# Patient Record
Sex: Male | Born: 1990 | Race: White | Hispanic: No | Marital: Single | Smoking: Current every day smoker
Health system: Southern US, Community
[De-identification: ages and names within clinical notes are randomized; demographics above are authoritative.]

## PROBLEM LIST (undated history)

## (undated) DIAGNOSIS — N186 End stage renal disease: Secondary | ICD-10-CM

## (undated) DIAGNOSIS — I10 Essential (primary) hypertension: Secondary | ICD-10-CM

## (undated) HISTORY — DX: Essential (primary) hypertension: I10

## (undated) HISTORY — DX: End stage renal disease: N18.6

## (undated) HISTORY — PX: AV FISTULA PLACEMENT: SHX1204

## (undated) HISTORY — PX: KIDNEY TRANSPLANT: SHX239

---
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
## 2010-08-04 ENCOUNTER — Ambulatory Visit: Payer: Self-pay | Admitting: Internal Medicine

## 2010-08-19 ENCOUNTER — Inpatient Hospital Stay: Payer: Self-pay | Admitting: Vascular Surgery

## 2010-09-04 ENCOUNTER — Ambulatory Visit: Payer: Self-pay | Admitting: Internal Medicine

## 2010-09-28 ENCOUNTER — Ambulatory Visit: Payer: Self-pay | Admitting: Vascular Surgery

## 2010-10-05 ENCOUNTER — Ambulatory Visit: Payer: Self-pay | Admitting: Vascular Surgery

## 2010-11-11 ENCOUNTER — Ambulatory Visit: Payer: Self-pay | Admitting: Vascular Surgery

## 2010-11-24 ENCOUNTER — Ambulatory Visit: Payer: Self-pay | Admitting: Vascular Surgery

## 2010-11-28 ENCOUNTER — Ambulatory Visit: Payer: Self-pay | Admitting: Vascular Surgery

## 2010-12-13 ENCOUNTER — Ambulatory Visit: Payer: Self-pay | Admitting: Vascular Surgery

## 2011-02-01 ENCOUNTER — Ambulatory Visit: Payer: Self-pay | Admitting: Internal Medicine

## 2011-08-15 ENCOUNTER — Other Ambulatory Visit: Payer: Self-pay | Admitting: Emergency Medicine

## 2011-08-15 LAB — BASIC METABOLIC PANEL
Anion Gap: 11 (ref 7–16)
BUN: 27 mg/dL — ABNORMAL HIGH (ref 7–18)
Chloride: 101 mmol/L (ref 98–107)
Creatinine: 7.43 mg/dL — ABNORMAL HIGH (ref 0.60–1.30)
EGFR (African American): 12 — ABNORMAL LOW
EGFR (Non-African Amer.): 10 — ABNORMAL LOW
Sodium: 140 mmol/L (ref 136–145)

## 2012-05-07 IMAGING — CR DG CHEST 1V PORT
1 series · 1 of 1 positions shown · non-contrast
Comparison: none

REASON FOR EXAM: line placement
COMMENTS:

[view not recorded]
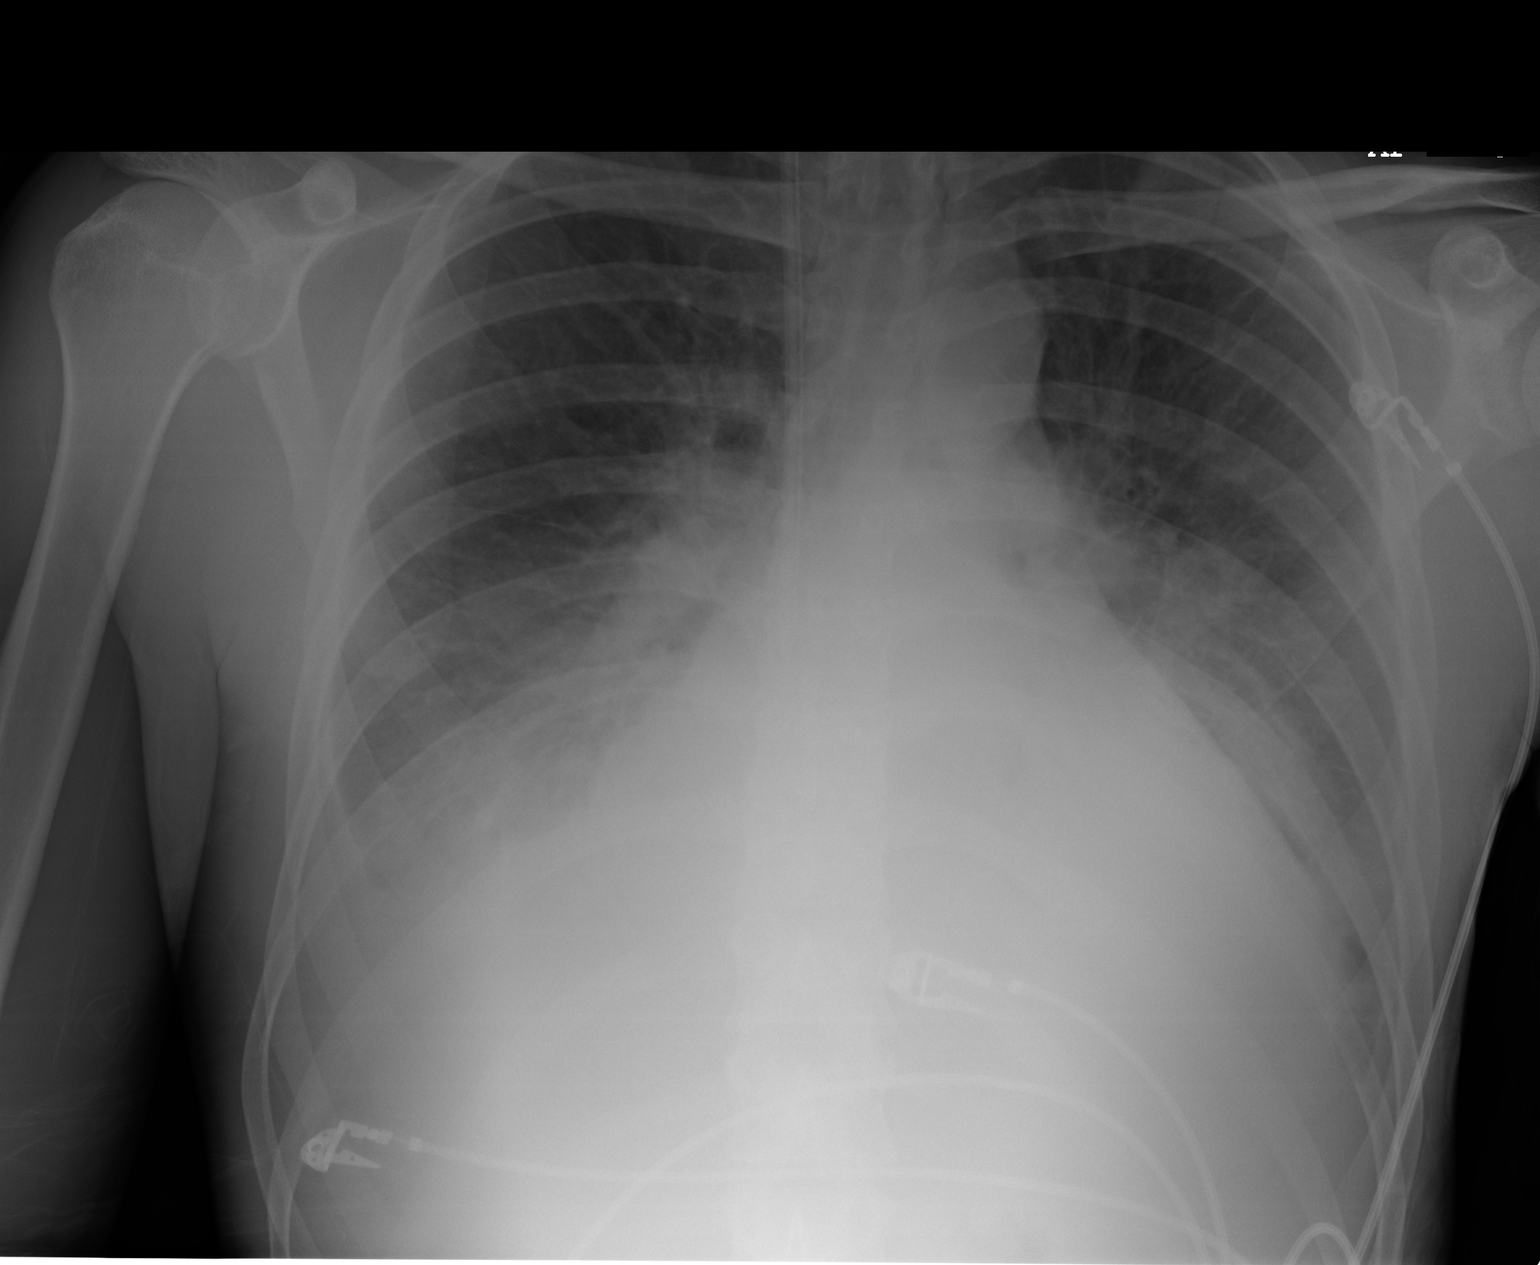

[1 of 1 positions shown; findings below may reference images not displayed]

PROCEDURE:     DXR - DXR PORTABLE CHEST SINGLE VIEW  - August 19, 2010  [DATE]

RESULT:     Comparison is made to the study of 08/19/2010. The cardiac
silhouette is borderline enlarged. There is a dual lumen central venous
catheter present with the distal tip appearing to be in the right atrium.
Bilateral pleural effusions persist but may be slightly smaller. Basilar
atelectasis is present. Mild pulmonary vascular congestion is demonstrated.
IMPRESSION: Interval placement of a dual lumen right-sided central
venous catheter as described.

## 2012-05-07 IMAGING — CR DG CHEST 2V
1 series · 2 of 2 positions shown · non-contrast
Comparison: none

REASON FOR EXAM: pain
COMMENTS:   May transport without cardiac monitor

[Series 1: view not recorded · 0.17mm/px · 2 of 2 slices shown]
[im 1/2]
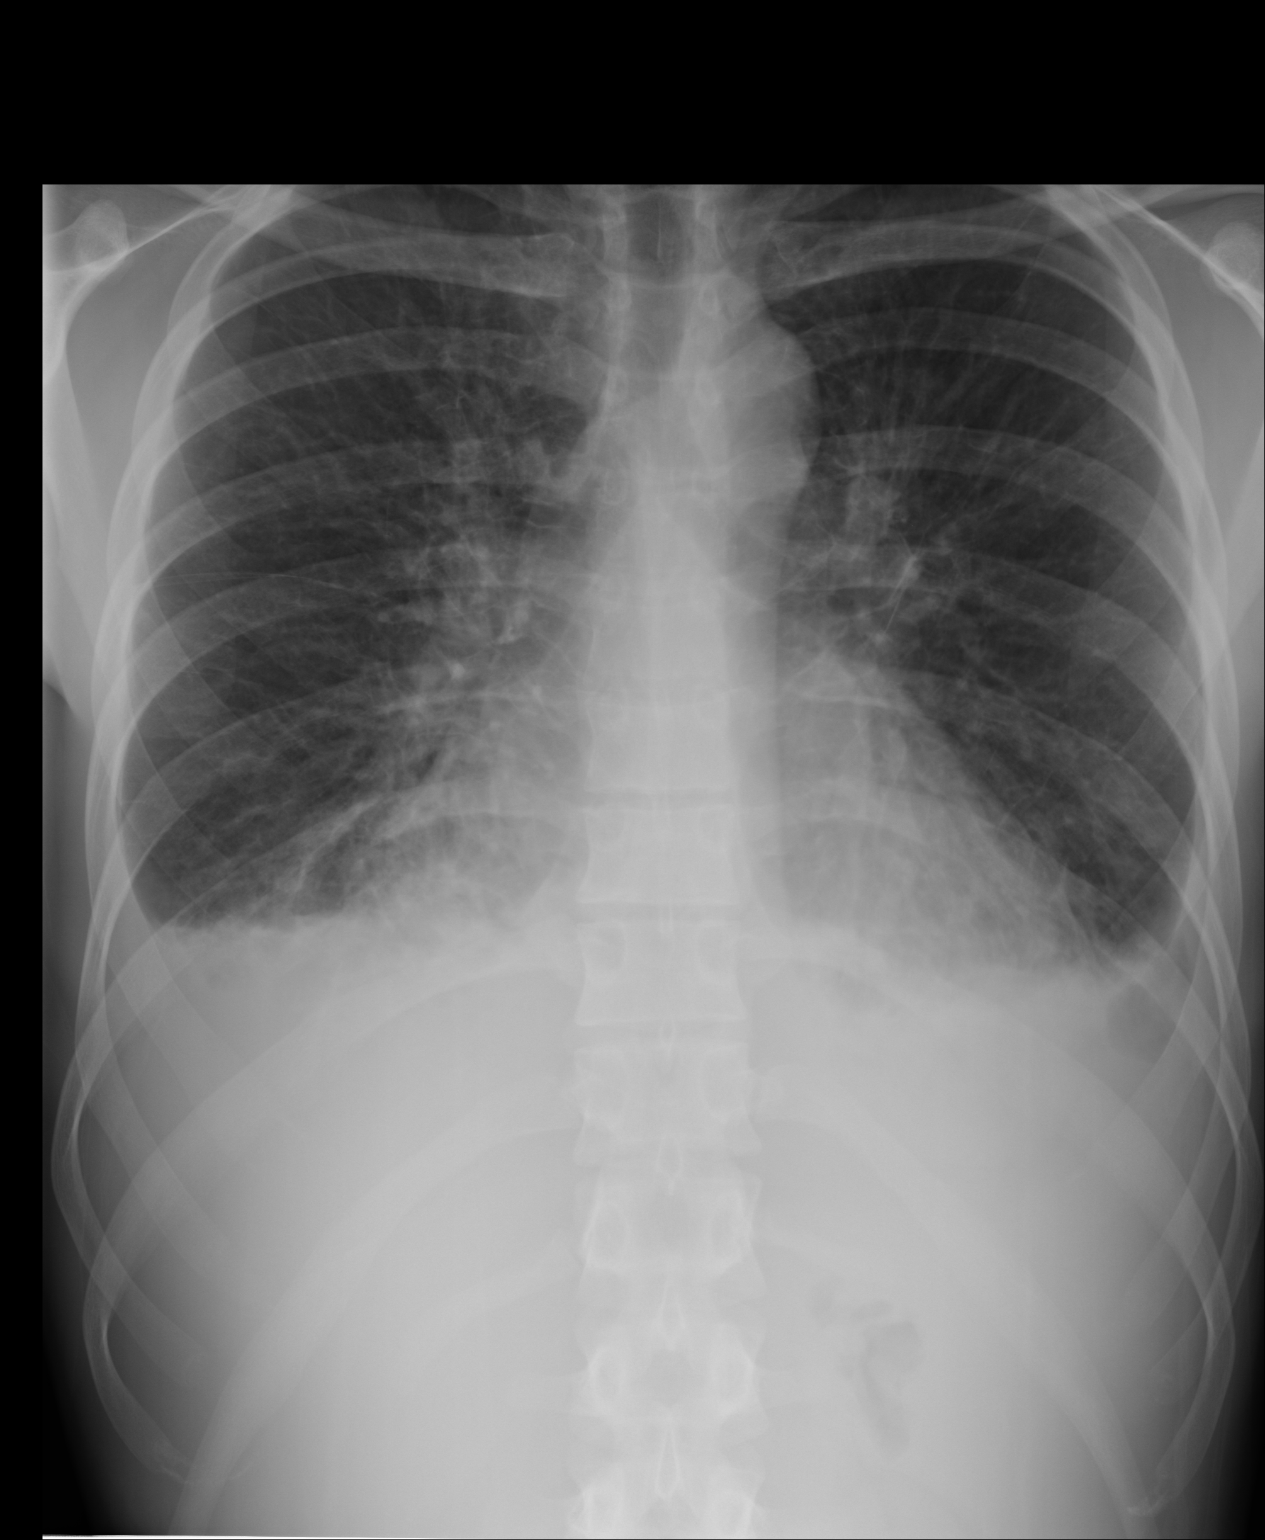
[im 2/2]
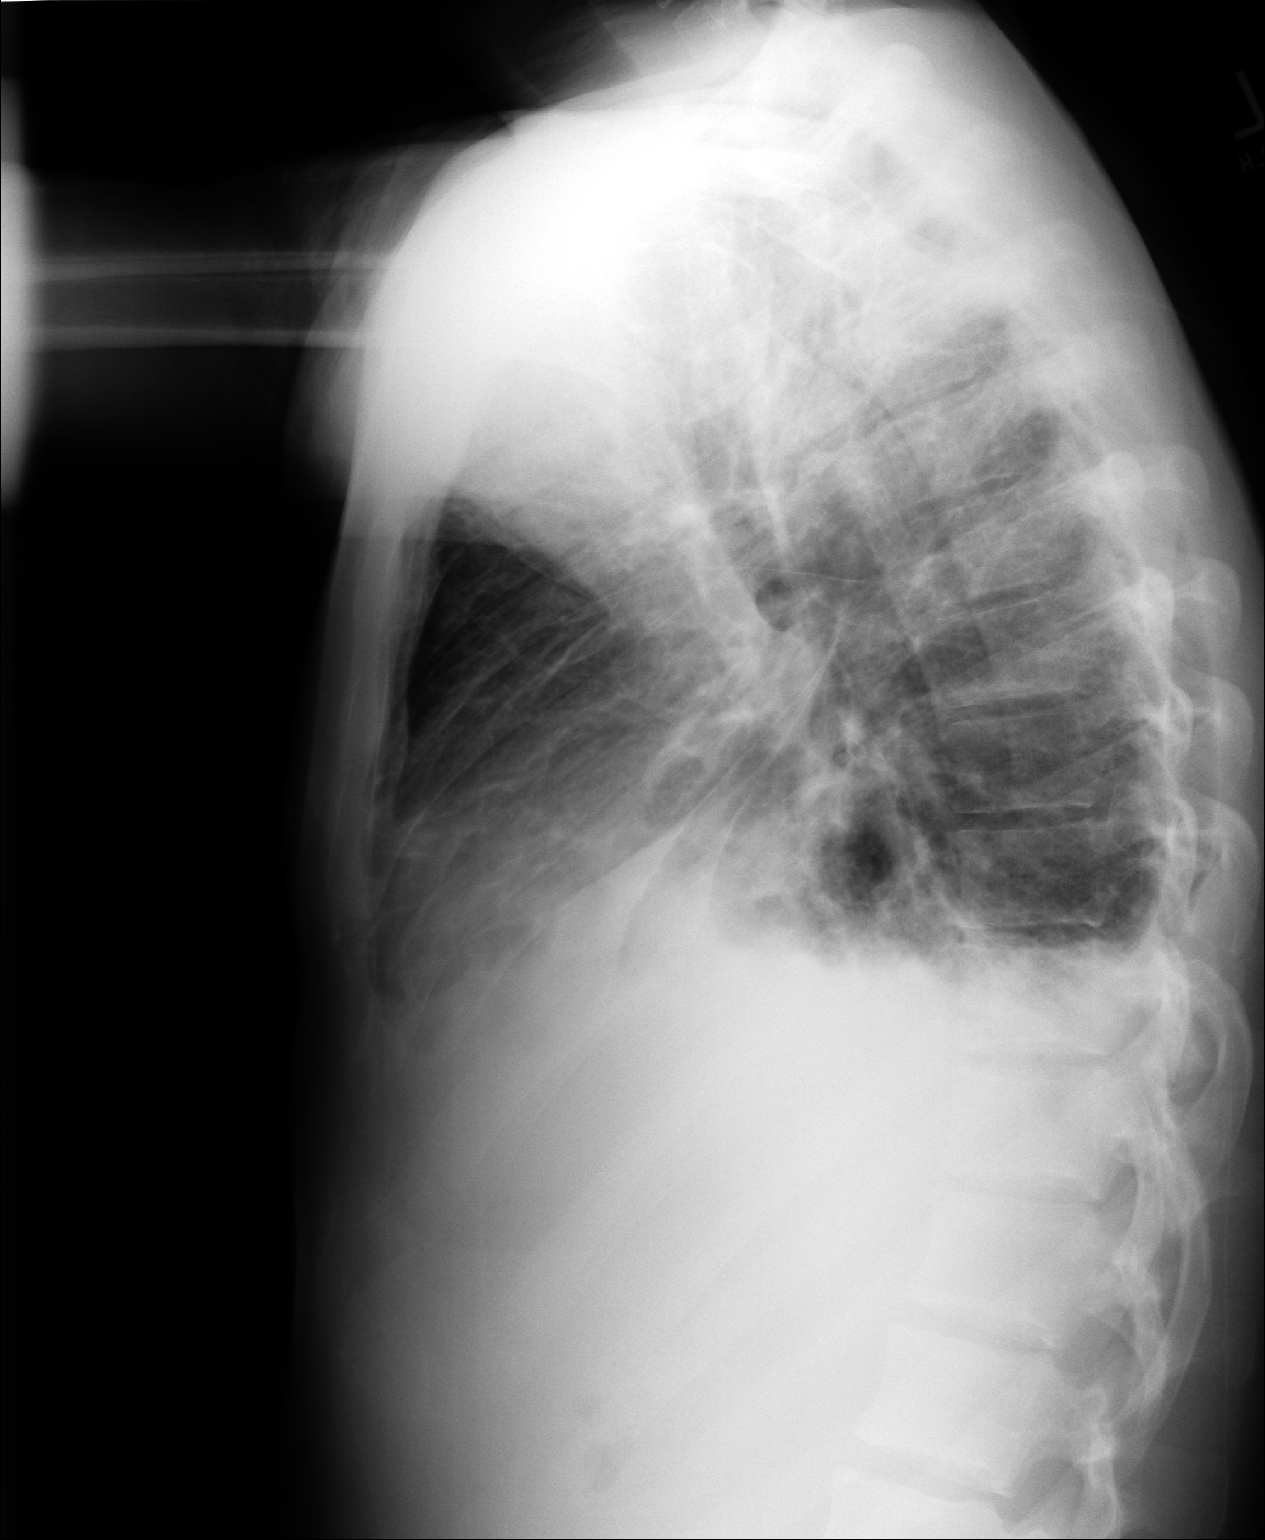

[2 of 2 positions shown; findings below may reference images not displayed]

PROCEDURE:     DXR - DXR CHEST PA (OR AP) AND LATERAL  - August 19, 2010  [DATE]

RESULT:     PA and lateral views of the chest were obtained. There is
prominence of the pulmonary vascularity compatible with pulmonary vascular
congestion. There are small bibasilar pleural effusions. The heart appears
mildly enlarged. Although cardiac disease cannot be excluded, in view of the
patient's age, noncardiac causes are more likely. Pneumonia, renal failure,
collagen disease, pulmonary emboli and allergic lung reactions are all
considerations in the differential at this point. The mediastinal and
osseous structures are normal in appearance.
IMPRESSION: 1.     There are bibasilar pleural effusions with associated pulmonary
vascular congestion. The etiology at this point is uncertain. As noted
above, for the patient's age, noncardiac etiologies are considered more
likely.

## 2012-05-09 IMAGING — CT CT CHEST W/O CM
2 of 3 series · 17 of 31 positions shown, 20 images · non-contrast
Comparison: none

REASON FOR EXAM: hemoptysis and renal failure-please do high res cuts
COMMENTS:

[Series 2: soft tissue · axial · 0.65mm/px · z∈[-812,-562]mm · 7 of 76 slices shown]
[im 11/76  mediastinal]
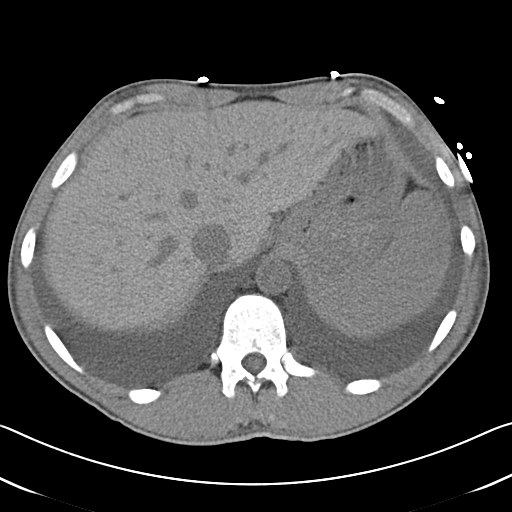
[im 21/76  mediastinal]
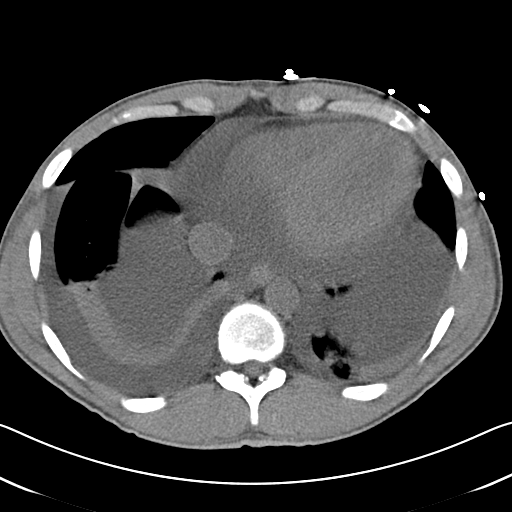
[im 31/76  mediastinal]
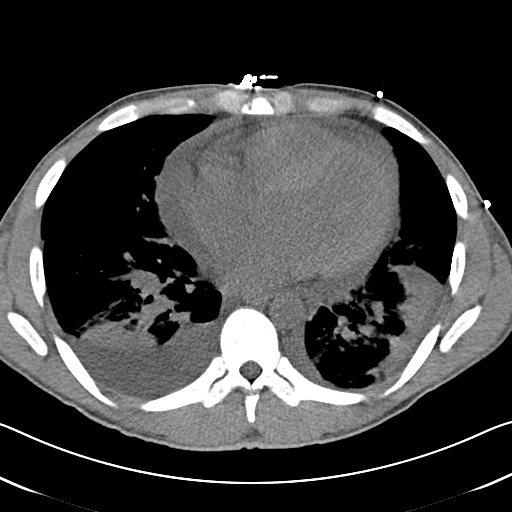
[im 36/76  mediastinal]
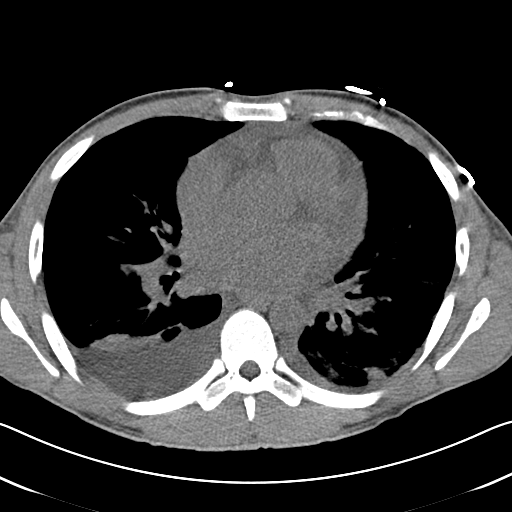
[im 41/76  mediastinal]
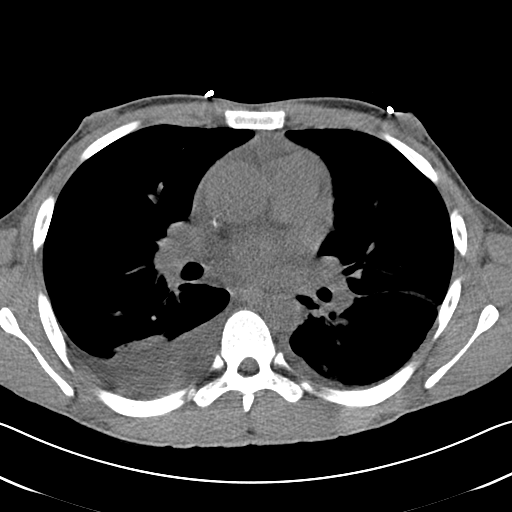
[im 51/76  mediastinal]
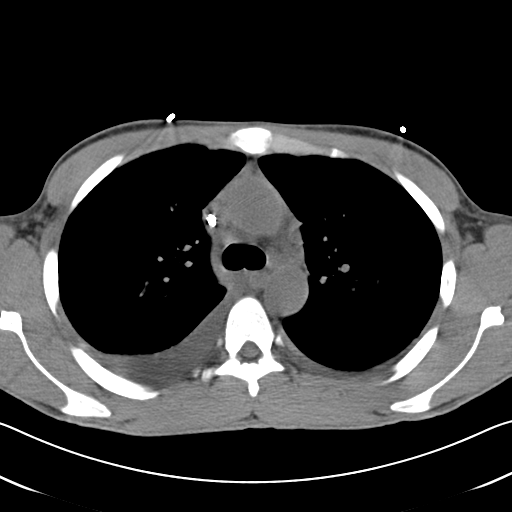
[im 61/76  mediastinal]
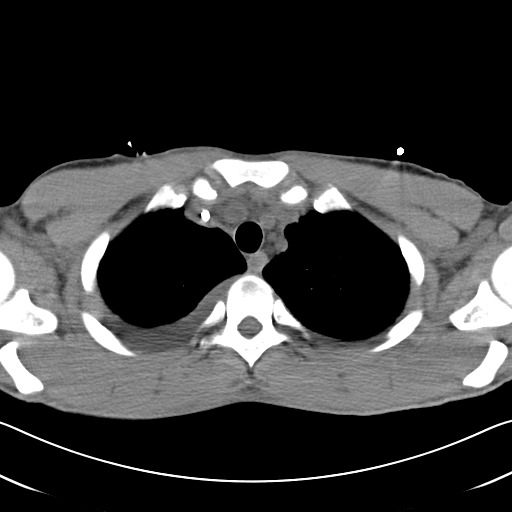

[Series 4: hi res · axial · 0.65mm/px · z∈[-834,-522]mm · 10 of 49 slices shown, 13 images]
[im 5/49  mediastinal]
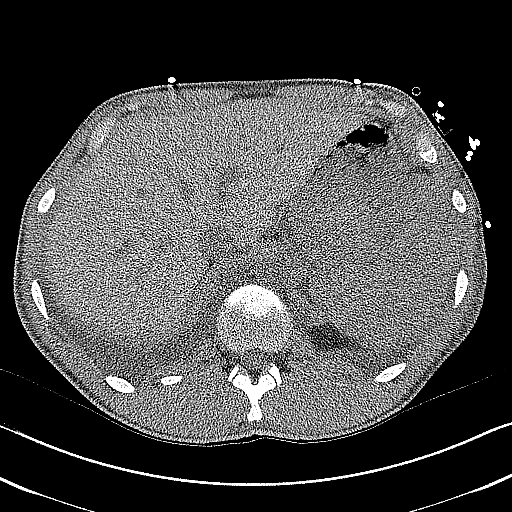
[im 5/49  lung]
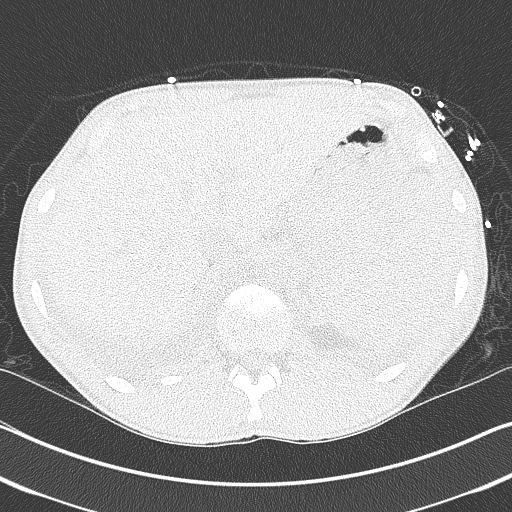
[im 10/49  lung]
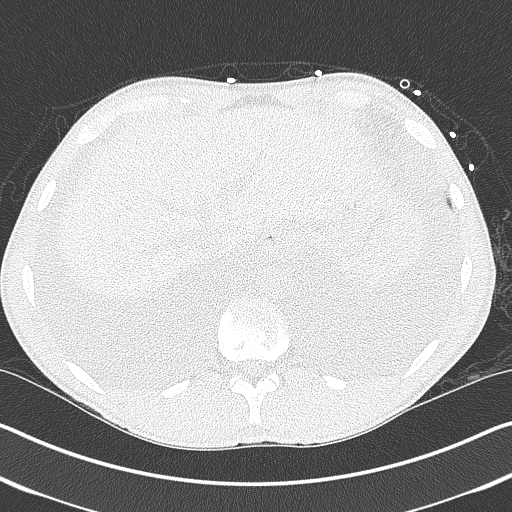
[im 15/49  lung]
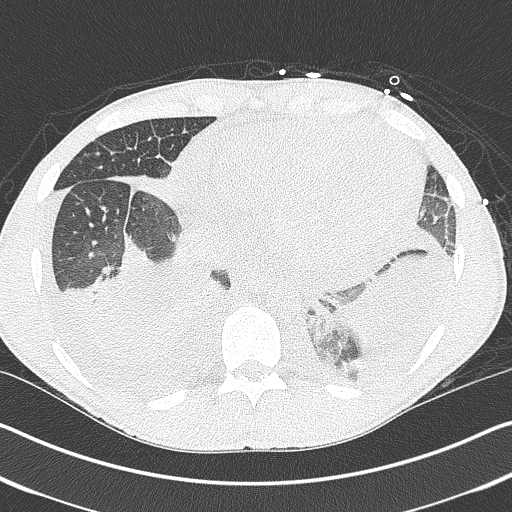
[im 20/49  lung]
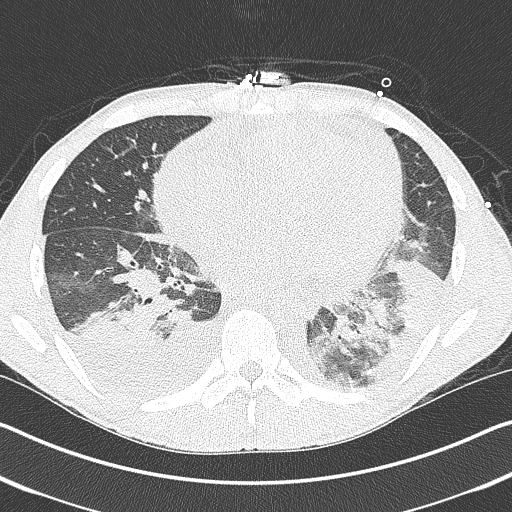
[im 23/49  mediastinal]
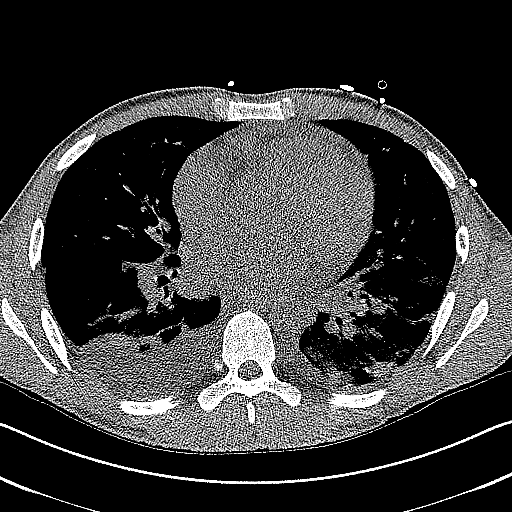
[im 23/49  lung]
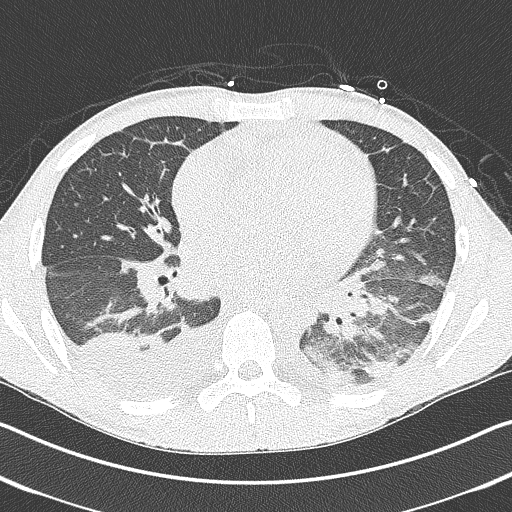
[im 25/49  lung]
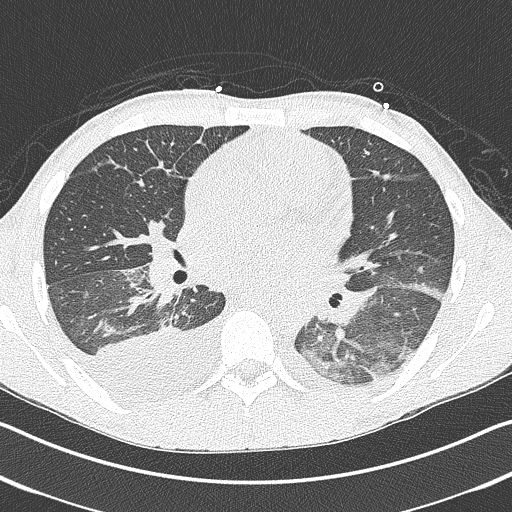
[im 29/49  lung]
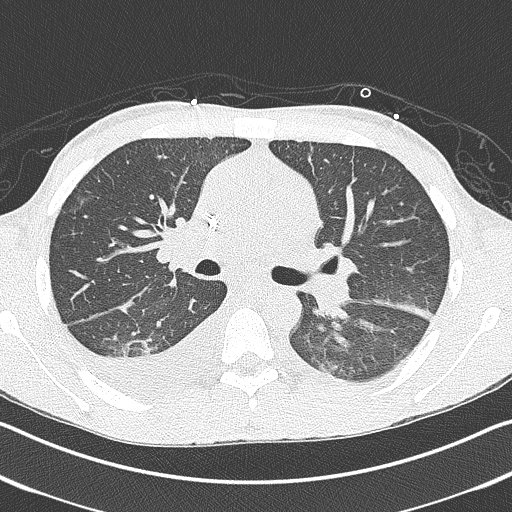
[im 34/49  lung]
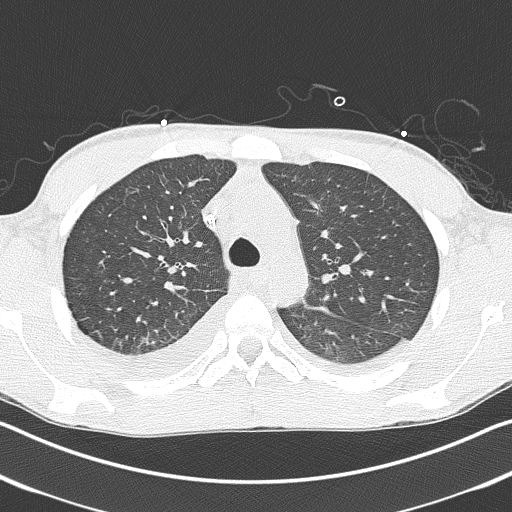
[im 39/49  mediastinal]
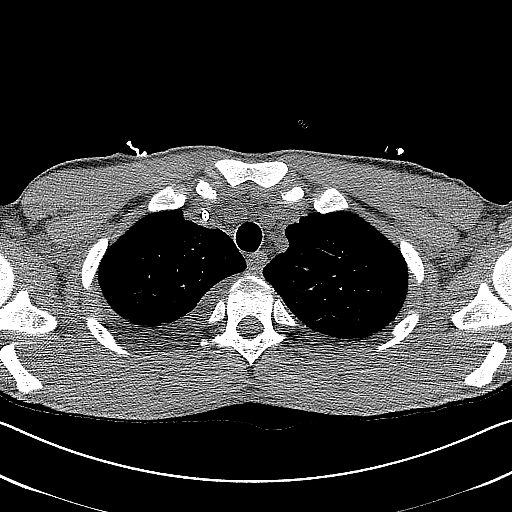
[im 39/49  lung]
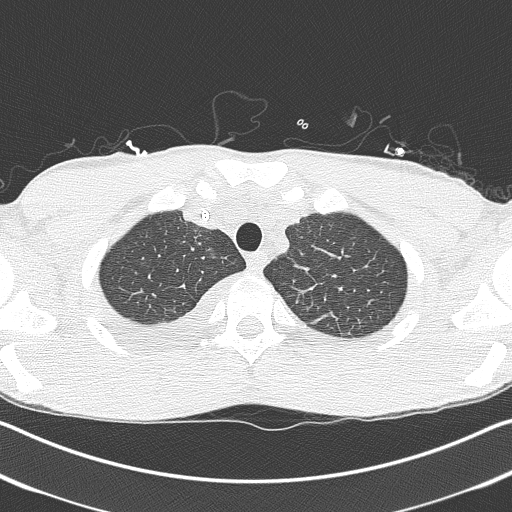
[im 44/49  lung]
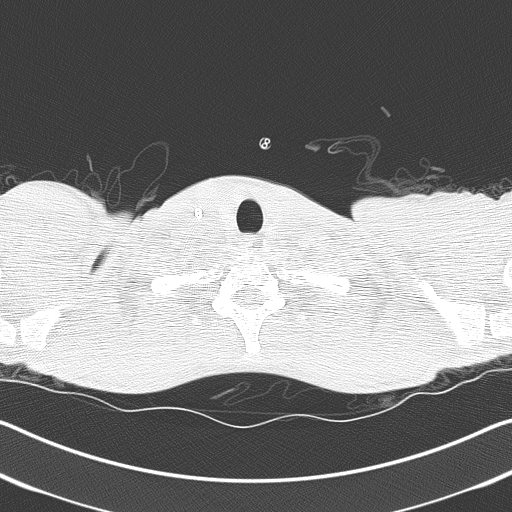

[17 of 31 positions shown; findings below may reference images not displayed]

PROCEDURE:     CT  - CT CHEST WITHOUT CONTRAST  - August 21, 2010 [DATE]

RESULT:     CT of the chest is performed utilizing a noncontrast technique.
CT of the chest demonstrates bilateral lower lobe pneumonia versus
atelectasis with small bilateral effusions. Heart is enlarged. There is a
suggestion of a small pericardial effusion. The mediastinum is difficult to
evaluate without contrast. There is no pneumothorax or evidence of diffuse
interstitial edema. No endobronchial lesion is appreciated.
IMPRESSION: 1. Bilateral pleural effusions with bilateral lower lobe areas of infiltrate
versus atelectasis. The included upper abdominal structures appear
unremarkable. Correlate with clinical and laboratory data.

## 2016-09-27 ENCOUNTER — Other Ambulatory Visit (INDEPENDENT_AMBULATORY_CARE_PROVIDER_SITE_OTHER): Payer: Self-pay | Admitting: Vascular Surgery

## 2016-09-27 ENCOUNTER — Other Ambulatory Visit (INDEPENDENT_AMBULATORY_CARE_PROVIDER_SITE_OTHER): Payer: Medicare Other

## 2016-09-27 ENCOUNTER — Encounter (INDEPENDENT_AMBULATORY_CARE_PROVIDER_SITE_OTHER): Payer: Self-pay | Admitting: Vascular Surgery

## 2016-09-27 ENCOUNTER — Ambulatory Visit (INDEPENDENT_AMBULATORY_CARE_PROVIDER_SITE_OTHER): Payer: Medicare Other | Admitting: Vascular Surgery

## 2016-09-27 VITALS — BP 134/78 | HR 81 | Resp 17 | Ht 75.5 in | Wt 216.0 lb

## 2016-09-27 DIAGNOSIS — Z940 Kidney transplant status: Secondary | ICD-10-CM | POA: Diagnosis not present

## 2016-09-27 DIAGNOSIS — I10 Essential (primary) hypertension: Secondary | ICD-10-CM

## 2016-09-27 DIAGNOSIS — M79602 Pain in left arm: Secondary | ICD-10-CM | POA: Diagnosis not present

## 2016-09-27 DIAGNOSIS — T829XXS Unspecified complication of cardiac and vascular prosthetic device, implant and graft, sequela: Secondary | ICD-10-CM

## 2016-10-05 ENCOUNTER — Ambulatory Visit (INDEPENDENT_AMBULATORY_CARE_PROVIDER_SITE_OTHER): Payer: Medicare Other | Admitting: Vascular Surgery

## 2016-10-05 ENCOUNTER — Encounter (INDEPENDENT_AMBULATORY_CARE_PROVIDER_SITE_OTHER): Payer: Self-pay | Admitting: Vascular Surgery

## 2016-10-05 DIAGNOSIS — T829XXS Unspecified complication of cardiac and vascular prosthetic device, implant and graft, sequela: Secondary | ICD-10-CM

## 2016-10-05 DIAGNOSIS — I10 Essential (primary) hypertension: Secondary | ICD-10-CM | POA: Diagnosis not present

## 2016-10-05 DIAGNOSIS — Z940 Kidney transplant status: Secondary | ICD-10-CM

## 2016-10-05 DIAGNOSIS — T829XXA Unspecified complication of cardiac and vascular prosthetic device, implant and graft, initial encounter: Secondary | ICD-10-CM | POA: Insufficient documentation

## 2016-10-05 NOTE — Progress Notes (Signed)
MRN : [id number]  Chad Dickson is a 26 y.o. (May 23, 1991) male who presents with chief complaint of  Chief Complaint  Patient presents with  . Follow-up  .  History of Present Illness: The patient returns to the office for follow up regarding problem with the dialysis access. 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  Prior to his new kidney he was maintained via a left wrist fistula.    The patient note redness and swelling at the access site which began about a week ago. The patient denies fever or chills at home.  The patient denies amaurosis fugax or recent TIA symptoms. There are no recent neurological changes noted. The patient denies claudication symptoms or rest pain symptoms. The patient denies history of DVT, PE or superficial thrombophlebitis. The patient denies recent episodes of angina or shortness of breath.      Current Meds  Medication Sig  . cephALEXin (KEFLEX) 500 MG capsule   . metoprolol (LOPRESSOR) 50 MG tablet   . mycophenolate (CELLCEPT) 250 MG capsule Take by mouth.  . predniSONE (DELTASONE) 5 MG tablet   . tacrolimus (PROGRAF) 1 MG capsule 2 mg at 9 AM, 1 mg at 9 PM---Z94.0--brand medically necessary    Past Medical History:  Diagnosis Date  . End stage renal disease (HCC)   . Hypertension     Past Surgical History:  Procedure Laterality Date  . AV FISTULA PLACEMENT    . KIDNEY TRANSPLANT      Social History Social History  Substance Use Topics  . Smoking status: Current Every Day Smoker  . Smokeless tobacco: Current User    Types: Snuff  . Alcohol use Yes     Comment: occasionally    Family History Family History  Problem Relation Age of Onset  . Hypertension Mother     No Known Allergies   REVIEW OF SYSTEMS (Negative unless checked)  Constitutional: [] Weight loss  [] Fever  [] Chills Cardiac: [] Chest pain   [] Chest pressure   [] Palpitations   [] Shortness of breath when laying flat   [] Shortness of breath with  exertion. Vascular:  [] Pain in legs with walking   [] Pain in legs at rest  [] History of DVT   [] Phlebitis   [] Swelling in legs   [] Varicose veins   [] Non-healing ulcers Pulmonary:   [] Uses home oxygen   [] Productive cough   [] Hemoptysis   [] Wheeze  [] COPD   [] Asthma Neurologic:  [] Dizziness   [] Seizures   [] History of stroke   [] History of TIA  [] Aphasia   [] Vissual changes   [] Weakness or numbness in arm   [] Weakness or numbness in leg Musculoskeletal:   [] Joint swelling   [] Joint pain   [] Low back pain Hematologic:  [] Easy bruising  [] Easy bleeding   [] Hypercoagulable state   [] Anemic Gastrointestinal:  [] Diarrhea   [] Vomiting  [] Gastroesophageal reflux/heartburn   [] Difficulty swallowing. Genitourinary:  [] Chronic kidney disease   [] Difficult urination  [] Frequent urination   [] Blood in urine Skin:  [] Rashes   [] Ulcers  Psychological:  [] History of anxiety   []  History of major depression.  Physical Examination  Vitals:   10/05/16 1527  BP: (!) 144/85  Pulse: 86  Resp: 17   There is no height or weight on file to calculate BMI. Gen: WD/WN, NAD Head: [place]/AT, No temporalis wasting.  Ear/Nose/Throat: Hearing grossly intact, nares w/o erythema or drainage Eyes: PER, EOMI, sclera nonicteric.  Neck: Supple, no large masses.   Pulmonary:  Good air movement, no audible  wheezing bilaterally, no use of accessory muscles.  Cardiac: RRR, no JVD Vascular: aneurysmal fistula with erythremia and mild induration, very week bruit no thrill Vessel Right Left  Radial Palpable Palpable  Ulnar Palpable Palpable  Brachial Palpable Palpable  Gastrointestinal: Non-distended. No guarding/no peritoneal signs.  Musculoskeletal: M/S 5/5 throughout.  No deformity or atrophy.  Neurologic: CN 2-12 intact. Symmetrical.  Speech is fluent. Motor exam as listed above. Psychiatric: Judgment intact, Mood & affect appropriate for pt's clinical situation. Dermatologic: No rashes or ulcers noted.  No changes  consistent with cellulitis. Lymph : No lichenification or skin changes of chronic lymphedema.  CBC No results found for: WBC, HGB, HCT, MCV, PLT  BMET    Component Value Date/Time   NA 140 08/15/2011 0830   K 5.8 (H) 08/15/2011 0830   CL 101 08/15/2011 0830   CO2 28 08/15/2011 0830   GLUCOSE 81 08/15/2011 0830   BUN 27 (H) 08/15/2011 0830   CREATININE 7.43 (H) 08/15/2011 0830   CALCIUM 9.6 08/15/2011 0830   GFRNONAA 10 (L) 08/15/2011 0830   GFRAA 12 (L) 08/15/2011 0830   CrCl cannot be calculated (Patient's most recent lab result is older than the maximum 21 days allowed.).  COAG No results found for: INR, PROTIME  Radiology No results found.  Assessment/Plan 1. Complication from renal dialysis device, sequela Recommend:  The patient is experiencing redness and mild tenderness of their dialysis access.  Duplex ultrasound shows there is near occlusive thrombus in the fistula.  I believe this is thrombophlebitis of the fistula but also discussed the possibility of infection.  We discussed excision of the fistula as it is not being used and the fistula is not functional anymore   The risks, benefits and alternative therapies were reviewed in detail with the patient.  All questions were answered.  The patient will continue conservative care and call the office if it worsens.    2. Essential hypertension Continue antihypertensive medications as already ordered, these medications have been reviewed and there are no changes at this time.   3. Transplanted kidney Continue antirejection medications as already ordered, these medications have been reviewed and there are no changes at this time.     Levora DredgeGregory Karcyn Menn, MD  10/05/2016 3:41 PM

## 2016-11-27 NOTE — Progress Notes (Signed)
Subjective:    Patient ID: Chad Dickson, male    DOB: 05-19-1991, 26 y.o.   MRN: [id number] Chief Complaint  Patient presents with  . Arm Swelling   Patient last seen in 2012. Patient status post a left radiocephalic AV fistula creation on 10/05/2010. Since then, he's had a renal transplant is and is not currently on dialysis. He presents today with a week of progressively worsening erythema and tenderness to his fistula site. The patient denies any drainage from the access site. The patient denies any fever, nausea or vomiting. The patient denies any left hand pain. 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   Review of Systems  Constitutional: Negative.   HENT: Negative.   Eyes: Negative.   Respiratory: Negative.   Cardiovascular:       Red and painful left access site  Gastrointestinal: Negative.   Endocrine: Negative.   Genitourinary: Negative.   Musculoskeletal: Negative.   Skin: Negative.   Allergic/Immunologic: Negative.   Neurological: Negative.   Hematological: Negative.   Psychiatric/Behavioral: Negative.       Objective:   Physical Exam  Constitutional: He is oriented to person, place, and time. He appears well-developed and well-nourished. No distress.  HENT:  Head: Normocephalic and atraumatic.  Eyes: Conjunctivae are normal. Pupils are equal, round, and reactive to light.  Neck: Normal range of motion.  Cardiovascular: Normal rate, regular rhythm, normal heart sounds and intact distal pulses.   Pulses:      Radial pulses are 2+ on the right side, and 2+ on the left side.  Left access site: With some erythema. Tender to palpation. There is no drainage. There is no cellulitis. Skin is intact.  Pulmonary/Chest: Effort normal.  Musculoskeletal: Normal range of  motion. He exhibits no edema.  Neurological: He is alert and oriented to person, place, and time.  Skin: Skin is warm and dry. He is not diaphoretic.  Psychiatric: He has a normal mood and affect. His behavior is normal. Judgment and thought content normal.  Vitals reviewed.   BP 134/78   Pulse 81   Resp 17   Ht 6' 3.5" (1.918 m)   Wt 216 lb (98 kg)   BMI 26.64 kg/m   Past Medical History:  Diagnosis Date  . End stage renal disease (HCC)   . Hypertension     Social History   Social History  . Marital status: Single    Spouse name: N/A  . Number of children: N/A  . Years of education: N/A   Occupational History  . Not on file.   Social History Main Topics  . Smoking status: Current Every Day Smoker  . Smokeless tobacco: Current User    Types: Snuff  . Alcohol use Yes     Comment: occasionally  . Drug use: No  . Sexual activity: Not on file   Other Topics Concern  . Not on file   Social History Narrative  . No narrative on file    Past Surgical History:  Procedure Laterality Date  . AV FISTULA PLACEMENT    . KIDNEY TRANSPLANT      Family History  Problem Relation Age of Onset  . Hypertension Mother     No Known Allergies     Assessment & Plan:  Patient last seen  in 2012. Patient status post a left radiocephalic AV fistula creation on 10/05/2010. Since then, he's had a renal transplant is and is not currently on dialysis. He presents today with a week of progressively worsening erythema and tenderness to his fistula site. The patient denies any drainage from the access site. The patient denies any fever, nausea or vomiting. The patient denies any left hand pain. 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  1. Essential hypertension - Stable Encouraged  good control as its slows the progression of atherosclerotic disease  2. Transplanted kidney - stable Currently the patient has a functioning kidney.  3. Complication from renal dialysis device - new Patient currently has a functioning kidney and does not need dialysis. This is most likely thrombosed phlebitis and I will treated conservatively. Keflex 500 mg 1 tab by mouth every 6 hours or 1 week Patient to follow up in 1 week to assess access site If there is any worsening to the site the patient should call the office sooner No plan on any endovascular intervention as the patient does not need dialysis and intervening would injure his kidney status  No current outpatient prescriptions on file prior to visit.   No current facility-administered medications on file prior to visit.     There are no Patient Instructions on file for this visit. No Follow-up on file.   Jathan Balling A Zaelynn Fuchs, PA-C
